# Patient Record
Sex: Male | Born: 1990 | Race: Black or African American | Hispanic: No | Marital: Single | Smoking: Never smoker
Health system: Southern US, Community
[De-identification: ages and names within clinical notes are randomized; demographics above are authoritative.]

---
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
## 2015-04-18 ENCOUNTER — Encounter (HOSPITAL_COMMUNITY): Payer: Self-pay | Admitting: *Deleted

## 2015-04-18 ENCOUNTER — Emergency Department (INDEPENDENT_AMBULATORY_CARE_PROVIDER_SITE_OTHER): Payer: BLUE CROSS/BLUE SHIELD

## 2015-04-18 ENCOUNTER — Emergency Department (INDEPENDENT_AMBULATORY_CARE_PROVIDER_SITE_OTHER)
Admission: EM | Admit: 2015-04-18 | Discharge: 2015-04-18 | Disposition: A | Discharge status: Home / Self Care | Payer: BLUE CROSS/BLUE SHIELD | Source: Home / Self Care | Attending: Family Medicine | Admitting: Family Medicine

## 2015-04-18 DIAGNOSIS — S93401A Sprain of unspecified ligament of right ankle, initial encounter: Secondary | ICD-10-CM | POA: Diagnosis not present

## 2015-04-18 MED ORDER — DICLOFENAC SODIUM 75 MG PO TBEC: 75.0000 mg | DELAYED_RELEASE_TABLET | Freq: Two times a day (BID) | ORAL | Status: DC | PRN

## 2015-04-18 NOTE — ED Notes (Signed)
Assessment per Dr. Denyse Amassorey.  Pt c/o rolling right ankle 2 days ago.

## 2015-04-18 NOTE — ED Provider Notes (Signed)
Joshua Sullivan is a 24 y.o. male who presents to Urgent Care today for ankle injury. Patient suffered an inversion injury to his right ankle 3  days ago while playing basketball. He notes pain and swelling and some limping. Wearing a tight shoe helps a lot. Ibuprofen and Tylenol have not been helpful. No radiating pain weakness numbness fevers or chills.   History reviewed. No pertinent past medical history. History reviewed. No pertinent past surgical history. History  Substance Use Topics  . Smoking status: Never Smoker   . Smokeless tobacco: Not on file  . Alcohol Use: No   ROS as above Medications: No current facility-administered medications for this encounter.   Current Outpatient Prescriptions  Medication Sig Dispense Refill  . diclofenac (VOLTAREN) 75 MG EC tablet Take 1 tablet (75 mg total) by mouth 2 (two) times daily as needed. 30 tablet 0   No Known Allergies   Exam:  BP 123/72 mmHg  Pulse 65  Temp(Src) 97.7 F (36.5 C) (Oral)  Resp 18  SpO2 96% Gen: Well NAD Right leg. Knee normal-appearing nontender normal motion stable ligamentous exam. Ankle swollen medially and laterally with maximal tenderness at the lateral malleolus but mild tenderness at the medial malleolus. Stable ligamentous exam. Foot normal-appearing nontender with normal pulses capillary refill and sensation.  No results found for this or any previous visit (from the past 24 hour(s)). Dg Ankle Complete Right  04/18/2015   CLINICAL DATA:  Lateral ankle pain and swelling. Initial encounter. Ankle injury.  EXAM: RIGHT ANKLE - COMPLETE 3+ VIEW  COMPARISON:  None.  FINDINGS: Anatomic alignment. Negative for fracture. Man ankle mortise congruent. Talar dome intact. 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  IMPRESSION: Negative.   Electronically Signed   By: Andreas NewportGeoffrey  Lamke M.D.   On: 04/18/2015 16:40    Assessment and Plan: 24 y.o. male with  ankle sprain. Treat with ASO brace and NSAIDs. Return as needed.  Discussed warning signs or symptoms. Please see discharge instructions. Patient expresses understanding.     Rodolph BongEvan S Roman Sandall, MD 04/18/15 [phone]

## 2015-04-18 NOTE — Discharge Instructions (Signed)
Thank you for coming in today. ° ° °Acute Ankle Sprain °with Phase I Rehab °An acute ankle sprain is a partial or complete tear in one or more of the ligaments of the ankle due to traumatic injury. The severity of the injury depends on both the number of ligaments sprained and the grade of sprain. There are 3 grades of sprains.  °· A grade 1 sprain is a mild sprain. There is a slight pull without obvious tearing. There is no loss of strength, and the muscle and ligament are the correct length. °· A grade 2 sprain is a moderate sprain. There is tearing of fibers within the substance of the ligament where it connects two bones or two cartilages. The length of the ligament is increased, and there is usually decreased strength. °· A grade 3 sprain is a complete rupture of the ligament and is uncommon. °In addition to the grade of sprain, there are three types of ankle sprains.  °Lateral ankle sprains: This is a sprain of one or more of the three ligaments on the outer side (lateral) of the ankle. These are the most common sprains. °Medial ankle sprains: There is one large triangular ligament of the inner side (medial) of the ankle that is susceptible to injury. Medial ankle sprains are less common. °Syndesmosis, "high ankle," sprains: The syndesmosis is the ligament that connects the two bones of the lower leg. Syndesmosis sprains usually only occur with very severe ankle sprains. °SYMPTOMS °· Pain, tenderness, and swelling in the ankle, starting at the side of injury that may progress to the whole ankle and foot with time. °· "Pop" or tearing sensation at the time of injury. °· Bruising that may spread to the heel. °· Impaired ability to walk soon after injury. °CAUSES  °· Acute ankle sprains are caused by trauma placed on the ankle that temporarily forces or pries the anklebone (talus) out of its normal socket. °· Stretching or tearing of the ligaments that normally hold the joint in place (usually due to a twisting  injury). °RISK INCREASES WITH: °· Previous ankle sprain. °· Sports in which the foot may land awkwardly (i.e., basketball, volleyball, or soccer) or walking or running on uneven or rough surfaces. °· Shoes with inadequate support to prevent sideways motion when stress occurs. °· Poor strength and flexibility. °· Poor balance skills. °· Contact sports. °PREVENTION  °· Warm up and stretch properly before activity. °· Maintain physical fitness: °¨ Ankle and leg flexibility, muscle strength, and endurance. °¨ Cardiovascular fitness. °· Balance training activities. °· Use proper technique and have a coach correct improper technique. °· Taping, protective strapping, bracing, or high-top tennis shoes may help prevent injury. Initially, tape is best; however, it loses most of its support function within 10 to 15 minutes. °· Wear proper-fitted protective shoes (High-top shoes with taping or bracing is more effective than either alone). °· Provide the ankle with support during sports and practice activities for 12 months following injury. °PROGNOSIS  °· If treated properly, ankle sprains can be expected to recover completely; however, the length of recovery depends on the degree of injury. °· A grade 1 sprain usually heals enough in 5 to 7 days to allow modified activity and requires an average of 6 weeks to heal completely. °· A grade 2 sprain requires 6 to 10 weeks to heal completely. °· A grade 3 sprain requires 12 to 16 weeks to heal. °· A syndesmosis sprain often takes more than 3 months to heal. °RELATED   COMPLICATIONS  °· Frequent recurrence of symptoms may result in a chronic problem. Appropriately addressing the problem the first time decreases the frequency of recurrence and optimizes healing time. Severity of the initial sprain does not predict the likelihood of later instability. °· Injury to other structures (bone, cartilage, or tendon). °· A chronically unstable or arthritic ankle joint is a possibility with  repeated sprains. °TREATMENT °Treatment initially involves the use of ice, medication, and compression bandages to help reduce pain and inflammation. Ankle sprains are usually immobilized in a walking cast or boot to allow for healing. Crutches may be recommended to reduce pressure on the injury. After immobilization, strengthening and stretching exercises may be necessary to regain strength and a full range of motion. Surgery is rarely needed to treat ankle sprains. °MEDICATION  °· Nonsteroidal anti-inflammatory medications, such as aspirin and ibuprofen (do not take for the first 3 days after injury or within 7 days before surgery), or other minor pain relievers, such as acetaminophen, are often recommended. Take these as directed by your caregiver. Contact your caregiver immediately if any bleeding, stomach upset, or signs of an allergic reaction occur from these medications. °· Ointments applied to the skin may be helpful. °· Pain relievers may be prescribed as necessary by your caregiver. Do not take prescription pain medication for longer than 4 to 7 days. Use only as directed and only as much as you need. °HEAT AND COLD °· Cold treatment (icing) is used to relieve pain and reduce inflammation for acute and chronic cases. Cold should be applied for 10 to 15 minutes every 2 to 3 hours for inflammation and pain and immediately after any activity that aggravates your symptoms. Use ice packs or an ice massage. °· Heat treatment may be used before performing stretching and strengthening activities prescribed by your caregiver. Use a heat pack or a warm soak. °SEEK IMMEDIATE MEDICAL CARE IF:  °· Pain, swelling, or bruising worsens despite treatment. °· You experience pain, numbness, discoloration, or coldness in the foot or toes. °· New, unexplained symptoms develop (drugs used in treatment may produce side effects.) °EXERCISES  °PHASE I EXERCISES °RANGE OF MOTION (ROM) AND STRETCHING EXERCISES - Ankle Sprain, Acute  Phase I, Weeks 1 to 2 °These exercises may help you when beginning to restore flexibility in your ankle. You will likely work on these exercises for the 1 to 2 weeks after your injury. Once your physician, physical therapist, or athletic trainer sees adequate progress, he or she will advance your exercises. While completing these exercises, remember:  °· Restoring tissue flexibility helps normal motion to return to the joints. This allows healthier, less painful movement and activity. °· An effective stretch should be held for at least 30 seconds. °· A stretch should never be painful. You should only feel a gentle lengthening or release in the stretched tissue. °RANGE OF MOTION - Dorsi/Plantar Flexion °· While sitting with your right / left knee straight, draw the top of your foot upwards by flexing your ankle. Then reverse the motion, pointing your toes downward. °· Hold each position for __________ seconds. °· After completing your first set of exercises, repeat this exercise with your knee bent. °Repeat __________ times. Complete this exercise __________ times per day.  °RANGE OF MOTION - Ankle Alphabet °· Imagine your right / left big toe is a pen. °· Keeping your hip and knee still, write out the entire alphabet with your "pen." Make the letters as large as you can without increasing any   discomfort. °Repeat __________ times. Complete this exercise __________ times per day.  °STRENGTHENING EXERCISES - Ankle Sprain, Acute -Phase I, Weeks 1 to 2 °These exercises may help you when beginning to restore strength in your ankle. You will likely work on these exercises for 1 to 2 weeks after your injury. Once your physician, physical therapist, or athletic trainer sees adequate progress, he or she will advance your exercises. While completing these exercises, remember:  °· Muscles can gain both the endurance and the strength needed for everyday activities through controlled exercises. °· Complete these exercises as  instructed by your physician, physical therapist, or athletic trainer. Progress the resistance and repetitions only as guided. °· You may experience muscle soreness or fatigue, but the pain or discomfort you are trying to eliminate should never worsen during these exercises. If this pain does worsen, stop and make certain you are following the directions exactly. If the pain is still present after adjustments, discontinue the exercise until you can discuss the trouble with your clinician. °STRENGTH - Dorsiflexors °· Secure a rubber exercise band/tubing to a fixed object (i.e., table, pole) and loop the other end around your right / left foot. °· Sit on the floor facing the fixed object. The band/tubing should be slightly tense when your foot is relaxed. °· Slowly draw your foot back toward you using your ankle and toes. °· Hold this position for __________ seconds. Slowly release the tension in the band and return your foot to the starting position. °Repeat __________ times. Complete this exercise __________ times per day.  °STRENGTH - Plantar-flexors  °· Sit with your right / left leg extended. Holding onto both ends of a rubber exercise band/tubing, loop it around the ball of your foot. Keep a slight tension in the band. °· Slowly push your toes away from you, pointing them downward. °· Hold this position for __________ seconds. Return slowly, controlling the tension in the band/tubing. °Repeat __________ times. Complete this exercise __________ times per day.  °STRENGTH - Ankle Eversion °· Secure one end of a rubber exercise band/tubing to a fixed object (table, pole). Loop the other end around your foot just before your toes. °· Place your fists between your knees. This will focus your strengthening at your ankle. °· Drawing the band/tubing across your opposite foot, slowly, pull your little toe out and up. Make sure the band/tubing is positioned to resist the entire motion. °· Hold this position for __________  seconds. °Have your muscles resist the band/tubing as it slowly pulls your foot back to the starting position.  °Repeat __________ times. Complete this exercise __________ times per day.  °STRENGTH - Ankle Inversion °· Secure one end of a rubber exercise band/tubing to a fixed object (table, pole). Loop the other end around your foot just before your toes. °· Place your fists between your knees. This will focus your strengthening at your ankle. °· Slowly, pull your big toe up and in, making sure the band/tubing is positioned to resist the entire motion. °· Hold this position for __________ seconds. °· Have your muscles resist the band/tubing as it slowly pulls your foot back to the starting position. °Repeat __________ times. Complete this exercises __________ times per day.  °STRENGTH - Towel Curls °· Sit in a chair positioned on a non-carpeted surface. °· Place your right / left foot on a towel, keeping your heel on the floor. °· Pull the towel toward your heel by only curling your toes. Keep your heel on the floor. °·   If instructed by your physician, physical therapist, or athletic trainer, add weight to the end of the towel. °Repeat __________ times. Complete this exercise __________ times per day. °Document Released: 06/29/2005 Document Revised: 04/14/2014 Document Reviewed: 03/12/2009 °[employer]® Patient Information ©2015 [employer]. This information is not intended to replace advice given to you by your health care provider. Make sure you discuss any questions you have with your health care provider. ° ° °Acute Ankle Sprain °with Phase II Rehab °An acute ankle sprain is a partial or complete tear in one or more of the ligaments of the ankle due to traumatic injury. The severity of the injury depends on both the number of ligaments sprained and the grade of sprain. There are 3 grades of sprains. °· A grade 1 sprain is a mild sprain. There is a slight pull without obvious tearing. There is no loss of  strength, and the muscle and ligament are the correct length. °· A grade 2 sprain is a moderate sprain. There is tearing of fibers within the substance of the ligament where it connects two bones or two cartilages. The length of the ligament is increased, and there is usually decreased strength. °· A grade 3 sprain is a complete rupture of the ligament and is uncommon. °In addition to the grade of sprain, there are 3 types of ankle sprains.  °Lateral ankle sprains. This is a sprain of one or more of the 3 ligaments on the outer side (lateral) of the ankle. These are the most common sprains. °Medial ankle sprains. There is one large triangular ligament on the inner side (medial) of the ankle that is susceptible to injury. Medial ankle sprains are less common. °Syndesmosis, "high ankle," sprains. The syndesmosis is the ligament that connects the two bones of the lower leg. Syndesmosis sprains usually only occur with very severe ankle sprains. °SYMPTOMS °· Pain, tenderness, and swelling in the ankle, starting at the side of injury that may progress to the whole ankle and foot with time. °· "Pop" or tearing sensation at the time of injury. °· Bruising that may spread to the heel. °· Impaired ability to walk soon after injury. °CAUSES  °· Acute ankle sprains are caused by trauma placed on the ankle that temporarily forces or pries the anklebone (talus) out of its normal socket. °· Stretching or tearing of the ligaments that normally hold the joint in place (usually due to a twisting injury). °RISK INCREASES WITH: °· Previous ankle sprain. °· Sports in which the foot may land awkwardly (basketball, volleyball, soccer) or walking or running on uneven or rough surfaces. °· Shoes with inadequate support to prevent sideways motion when stress occurs. °· Poor strength and flexibility. °· Poor balance skills. °· Contact sports. °PREVENTION °· Warm up and stretch properly before activity. °· Maintain physical fitness: °¨ Ankle  and leg flexibility, muscle strength, and endurance. °¨ Cardiovascular fitness. °· Balance training activities. °· Use proper technique and have a coach correct improper technique. °· Taping, protective strapping, bracing, or high-top tennis shoes may help prevent injury. Initially, tape is best. However, it loses most of its support function within 10 to 15 minutes. °· Wear proper fitted protective shoes. Combining high-top shoes with taping or bracing is more effective than using either alone. °· Provide the ankle with support during sports and practice activities for 12 months following injury. °PROGNOSIS  °· If treated properly, ankle sprains can be expected to recover completely. However, the length of recovery depends on the degree of   injury. °· A grade 1 sprain usually heals enough in 5 to 7 days to allow modified activity and requires an average of 6 weeks to heal completely. °· A grade 2 sprain requires 6 to 10 weeks to heal completely. °· A grade 3 sprain requires 12 to 16 weeks to heal. °· A syndesmosis sprain often takes more than 3 months to heal. °RELATED COMPLICATIONS  °· Frequent recurrence of symptoms may result in a chronic problem. Appropriately addressing the problem the first time decreases the frequency of recurrence and optimizes healing time. Severity of initial sprain does not predict the likelihood of later instability. °· Injury to other structures (bone, cartilage, or tendon). °· Chronically unstable or arthritic ankle joint are possible with repeated sprains. °TREATMENT °Treatment initially involves the use of ice, medicine, and compression bandages to help reduce pain and inflammation. Ankle sprains are usually immobilized in a walking cast or boot to allow for healing. Crutches may be recommended to reduce pressure on the injury. After immobilization, strengthening and stretching exercises may be necessary to regain strength and a full range of motion. Surgery is rarely needed to treat  ankle sprains. °MEDICATION  °· Nonsteroidal anti-inflammatory medicines, such as aspirin and ibuprofen (do not take for the first 3 days after injury or within 7 days before surgery), or other minor pain relievers, such as acetaminophen, are often recommended. Take these as directed by your caregiver. Contact your caregiver immediately if any bleeding, stomach upset, or signs of an allergic reaction occur from these medicines. °· Ointments applied to the skin may be helpful. °· Pain relievers may be prescribed as necessary by your caregiver. Do not take prescription pain medicine for longer than 4 to 7 days. Use only as directed and only as much as you need. °HEAT AND COLD °· Cold treatment (icing) is used to relieve pain and reduce inflammation for acute and chronic cases. Cold should be applied for 10 to 15 minutes every 2 to 3 hours for inflammation and pain and immediately after any activity that aggravates your symptoms. Use ice packs or an ice massage. °· Heat treatment may be used before performing stretching and strengthening activities prescribed by your caregiver. Use a heat pack or a warm soak. °SEEK IMMEDIATE MEDICAL CARE IF:  °· Pain, swelling, or bruising worsens despite treatment. °· You experience pain, numbness, discoloration, or coldness in the foot or toes. °· New, unexplained symptoms develop. (Drugs used in treatment may produce side effects.) °EXERCISES  °PHASE II EXERCISES °RANGE OF MOTION (ROM) AND STRETCHING EXERCISES - Ankle Sprain, Acute-Phase II, Weeks 3 to 4 °After your physician, physical therapist, or athletic trainer feels your knee has made progress significant enough to begin more advanced exercises, he or she may recommend completing some of the following exercises. Although each person heals at different rates, most people will be ready for these exercises between 3 and 4 weeks after their injury. Do not begin these exercises until you have your caregiver's permission. He or she  may also advise you to continue with the exercises which you completed in Phase I of your rehabilitation. While completing these exercises, remember:  °· Restoring tissue flexibility helps normal motion to return to the joints. This allows healthier, less painful movement and activity. °· An effective stretch should be held for at least 30 seconds. °· A stretch should never be painful. You should only feel a gentle lengthening or release in the stretched tissue. °RANGE OF MOTION - Ankle Plantar Flexion  °·   Sit with your right / left leg crossed over your opposite knee. °· Use your opposite hand to pull the top of your foot and toes toward you. °· You should feel a gentle stretch on the top of your foot/ankle. Hold this position for __________. °Repeat __________ times. Complete __________ times per day.  °RANGE OF MOTION - Ankle Eversion °· Sit with your right / left ankle crossed over your opposite knee. °· Grip your foot with your opposite hand, placing your thumb on the top of your foot and your fingers across the bottom of your foot. °· Gently push your foot downward with a slight rotation so your littlest toes rise slightly °· You should feel a gentle stretch on the inside of your ankle. Hold the stretch for __________ seconds. °Repeat __________ times. Complete this exercise __________ times per day.  °RANGE OF MOTION - Ankle Inversion °· Sit with your right / left ankle crossed over your opposite knee. °· Grip your foot with your opposite hand, placing your thumb on the bottom of your foot and your fingers across the top of your foot. °· Gently pull your foot so the smallest toe comes toward you and your thumb pushes the inside of the ball of your foot away from you. °· You should feel a gentle stretch on the outside of your ankle. Hold the stretch for __________ seconds. °Repeat __________ times. Complete this exercise __________ times per day.  °STRETCH - Gastrocsoleus °· Sit with your right / left leg  extended. Holding onto both ends of a belt or towel, loop it around the ball of your foot. °· Keeping your right / left ankle and foot relaxed and your knee straight, pull your foot and ankle toward you using the belt/towel. °· You should feel a gentle stretch behind your calf or knee. Hold this position for __________ seconds. °Repeat __________ times. Complete this stretch __________ times per day.  °RANGE OF MOTION - Ankle Dorsiflexion, Active Assisted °· Remove shoes and sit on a chair that is preferably not on a carpeted surface. °· Place right / left foot under knee. Extend your opposite leg for support. °· Keeping your heel down, slide your right / left foot back toward the chair until you feel a stretch at your ankle or calf. If you do not feel a stretch, slide your bottom forward to the edge of the chair while still keeping your heel down. °· Hold this stretch for __________ seconds. °Repeat __________ times. Complete this stretch __________ times per day.  °STRETCH - Gastroc, Standing  °· Place hands on wall. °· Extend right / left leg and place a folded washcloth under the arch of your foot for support. Keep the front knee somewhat bent. °· Slightly point your toes inward on your back foot. °· Keeping your right / left heel on the floor and your knee straight, shift your weight toward the wall, not allowing your back to arch. °· You should feel a gentle stretch in the calf. Hold this position for __________ seconds. °Repeat __________ times. Complete this stretch __________ times per day. °STRETCH - Soleus, Standing °· Place hands on wall. °· Extend right / left leg and place a folded washcloth under the arch of your foot for support. Keep the front knee somewhat bent. °· Slightly point your toes inward on your back foot. °· Keep your right / left heel on the floor, bend your back knee, and slightly shift your weight over the back leg so   that you feel a gentle stretch deep in your back calf. °· Hold this  position for __________ seconds. °Repeat __________ times. Complete this stretch __________ times per day. °STRETCH - Gastrocsoleus, Standing °Note: This exercise can place a lot of stress on your foot and ankle. Please complete this exercise only if specifically instructed by your caregiver.  °· Place the ball of your right / left foot on a step, keeping your other foot firmly on the same step. °· Hold on to the wall or a rail for balance. °· Slowly lift your other foot, allowing your body weight to press your heel down over the edge of the step. °· You should feel a stretch in your right / left calf. °· Hold this position for __________ seconds. °· Repeat this exercise with a slight bend in your knee. °Repeat __________ times. Complete this stretch __________ times per day.  °STRENGTHENING EXERCISES - Ankle Sprain, Acute-Phase II °Around 3 to 4 weeks after your injury, you may progress to some of these exercises in your rehabilitation program. Do not begin these until you have your caregiver's permission. Although your condition has improved, the Phase I exercises will continue to be helpful and you may continue to complete them. As you complete strengthening exercises, remember:  °· Strong muscles with good endurance tolerate stress better. °· Do the exercises as initially prescribed by your caregiver. Progress slowly with each exercise, gradually increasing the number of repetitions and weight used under his or her guidance. °· You may experience muscle soreness or fatigue, but the pain or discomfort you are trying to eliminate should never worsen during these exercises. If this pain does worsen, stop and make certain you are following the directions exactly. If the pain is still present after adjustments, discontinue the exercise until you can discuss the trouble with your caregiver. °STRENGTH - Plantar-flexors, Standing °· Stand with your feet shoulder width apart. Steady yourself with a wall or table using as  little support as needed. °· Keeping your weight evenly spread over the width of your feet, rise up on your toes.* °· Hold this position for __________ seconds. °Repeat __________ times. Complete this exercise __________ times per day.  °*If this is too easy, shift your weight toward your right / left leg until you feel challenged. Ultimately, you may be asked to do this exercise with your right / left foot only. °STRENGTH - Dorsiflexors and Plantar-flexors, Heel/toe Walking °· Dorsiflexion: Walk on your heels only. Keep your toes as high as possible. °· Walk for ____________________ seconds/feet. °· Repeat __________ times. Complete __________ times per day. °· Plantar flexion: Walk on your toes only. Keep your heels as high as possible. °· Walk for ____________________ seconds/feet. °Repeat __________ times. Complete __________ times per day.  °BALANCE - Tandem Walking °· Place your uninjured foot on a line 2 to 4 inches wide and at least 10 feet long. °· Keeping your balance without using anything for extra support, place your right / left heel directly in front of your other foot. °· Slowly raise your back foot up, lifting from the heel to the toes, and place it directly in front of the right / left foot. °· Continue to walk along the line slowly. Walk for ____________________ feet. °Repeat ____________________ times. Complete ____________________ times per day. °BALANCE - Inversion/Eversion °Use caution, these are advanced level exercises. Do not begin them until you are advised to do so.  °· Create a balance board using a sturdy board about 1 ½   feet long and at 1 to 1 ½ feet wide and a 1 ½ inch diameter rod or pipe that is as long as the board's width. A copper pipe or a solid broomstick work well. °· Stand on a non-carpeted surface near a countertop or wall. Step onto the board so that your feet are hip-width apart and equally straddle the rod/pipe. °· Keeping your feet in place, complete these two exercises  without shifting your upper body or hips: °¨ Tip the board from side-to-side. Control the movement so the board does not forcefully strike the ground. The board should silently tap the ground. °¨ Tip the board side-to-side without striking the ground. Occasionally pause and maintain a steady position at various points. °¨ Repeat the first two exercises, but use only your right / left foot. Place your right / left foot directly over the rod/pipe. °Repeat __________ times. Complete this exercise __________ times a day. °BALANCE - Plantar/Dorsi Flexion °Use caution, these are advanced level exercises. Do not begin them until you are advised to do so.  °· Create a balance board using a sturdy board about 1 ½ feet long and at 1 to 1 ½ feet wide and a 1 ½ inch diameter rod or pipe that is as long as the board's width. A copper pipe or a solid broomstick work well. °· Stand on a non-carpeted surface near a countertop or wall. Stand on the board so that the rod/pipe runs under the arches in your feet. °· Keeping your feet in place, complete these two exercises without shifting your upper body or hips: °¨ Tip the board from side-to-side. Control the movement so the board does not forcefully strike the ground. The board should silently tap the ground. °¨ Tip the board side-to-side without striking the ground. Occasionally pause and maintain a steady position at various points. °¨ Repeat the first two exercises, but use only your right / left foot. Stand in the center of the board. °Repeat __________ times. Complete this exercise __________ times a day. °STRENGTH - Plantar-flexors, Eccentric °Note: This exercise can place a lot of stress on your foot and ankle. Please complete this exercise only if specifically instructed by your caregiver.  °· Place the balls of your feet on a step. With your hands, use only enough support from a wall or rail to keep your balance. °· Keep your knees straight and rise up on your  toes. °· Slowly shift your weight entirely to your toes and pick up your opposite foot. Gently and with controlled movement, lower your weight through your right / left foot so that your heel drops below the level of the step. You will feel a slight stretch in the back of your calf at the ending position. °· Use the healthy leg to help rise up onto the balls of both feet, then lower weight only on the right / left leg again. Build up to 15 repetitions. Then progress to 3 consecutive sets of 15 repetitions.* °· After completing the above exercise, complete the same exercise with a slight knee bend (about 30 degrees). Again, build up to 15 repetitions. Then progress to 3 consecutive sets of 15 repetitions.* °Perform this exercise __________ times per day.  °*When you easily complete 3 sets of 15, your physician, physical therapist, or athletic trainer may advise you to add resistance by wearing a backpack filled with additional weight. °Document Released: 03/20/2006 Document Revised: 02/20/2012 Document Reviewed: 03/12/2009 °[employer]® Patient Information ©2015 [employer]. This information is   not intended to replace advice given to you by your health care provider. Make sure you discuss any questions you have with your health care provider. ° °

## 2015-07-13 ENCOUNTER — Emergency Department (INDEPENDENT_AMBULATORY_CARE_PROVIDER_SITE_OTHER)
Admission: EM | Admit: 2015-07-13 | Discharge: 2015-07-13 | Disposition: A | Discharge status: Home / Self Care | Payer: BLUE CROSS/BLUE SHIELD | Source: Home / Self Care | Attending: Family Medicine | Admitting: Family Medicine

## 2015-07-13 ENCOUNTER — Emergency Department (INDEPENDENT_AMBULATORY_CARE_PROVIDER_SITE_OTHER): Payer: BLUE CROSS/BLUE SHIELD

## 2015-07-13 ENCOUNTER — Encounter (HOSPITAL_COMMUNITY): Payer: Self-pay | Admitting: *Deleted

## 2015-07-13 DIAGNOSIS — S93402A Sprain of unspecified ligament of left ankle, initial encounter: Secondary | ICD-10-CM | POA: Diagnosis not present

## 2015-07-13 MED ORDER — DICLOFENAC POTASSIUM 50 MG PO TABS: 50.0000 mg | ORAL_TABLET | Freq: Three times a day (TID) | ORAL | Status: DC

## 2015-07-13 NOTE — ED Notes (Signed)
Pt  Reports  He  Twisted  His   l  Ankle      Yesterday  While  Playing  Basketball  -  He  Has  Pain  And  Swelling  To  The l  Ankle         He  denys  Any    Other injurys     He  Is sitting  In  Wheelchair

## 2015-07-13 NOTE — ED Provider Notes (Signed)
CSN: [id number]     Arrival date & time 07/13/15  1624 History   First MD Initiated Contact with Patient 07/13/15 1820     Chief Complaint  Patient presents with  . Ankle Pain   (Consider location/radiation/quality/duration/timing/severity/associated sxs/prior Treatment) Patient is a 24 y.o. male presenting with ankle pain. The history is provided by the patient.  Ankle Pain Location:  Ankle Time since incident:  1 day Injury: yes   Mechanism of injury comment:  Twisted playing bball. Ankle location:  L ankle Pain details:    Radiates to:  Does not radiate   Progression:  Unchanged Chronicity:  New Dislocation: no   Relieved by:  None tried Worsened by:  Nothing tried Ineffective treatments:  None tried Associated symptoms: decreased ROM, stiffness and swelling     History reviewed. No pertinent past medical history. History reviewed. No pertinent past surgical history. History reviewed. No pertinent family history. History  Substance Use Topics  . Smoking status: Never Smoker   . Smokeless tobacco: Not on file  . Alcohol Use: No    Review of Systems  Constitutional: Negative.   Musculoskeletal: Positive for joint swelling and stiffness.  Skin: Negative.     Allergies  Review of patient's allergies indicates no known allergies.  Home Medications   Prior to Admission medications   Medication Sig Start Date End Date Taking? Authorizing Provider  diclofenac (CATAFLAM) 50 MG tablet Take 1 tablet (50 mg total) by mouth 3 (three) times daily. 07/13/15   Linna Hoff, MD  diclofenac (VOLTAREN) 75 MG EC tablet Take 1 tablet (75 mg total) by mouth 2 (two) times daily as needed. 04/18/15   Rodolph Bong, MD   BP 132/86 mmHg  Pulse 78  Temp(Src) 98.6 F (37 C) (Oral)  Resp 20  SpO2 100% Physical Exam  Constitutional: He is oriented to person, place, and time. He appears well-developed and well-nourished. No distress.  Musculoskeletal: He exhibits tenderness.       Left  ankle: He exhibits decreased range of motion and swelling. He exhibits no ecchymosis and normal pulse. Tenderness. Lateral malleolus tenderness found. No head of 5th metatarsal and no proximal fibula tenderness found. Achilles tendon normal.  Neurological: He is alert and oriented to person, place, and time.  Skin: Skin is warm and dry.  Nursing note and vitals reviewed.   ED Course  Procedures (including critical care time) Labs Review Labs Reviewed - No data to display  Imaging Review Dg Ankle Complete Left  07/13/2015   CLINICAL DATA:  Initial encounter for ankle injury playing basketball earlier today with lateral pain and swelling.  EXAM: LEFT ANKLE COMPLETE - 3+ VIEW  COMPARISON:  None.  FINDINGS: No evidence for fracture. No subluxation or dislocation. Ankle mortise is preserved. Sclerotic, slightly expansile lesion in the lateral cortex of the distal tibial metaphysis is compatible with a healed nonossifying fibroma. Soft tissue swelling/ edema overlies the lateral malleolus.  IMPRESSION: Lateral soft tissue swelling without acute fracture.   Electronically Signed   By: Kennith Center M.D.   On: 07/13/2015 19:06    X-rays reviewed and report per radiologist.  MDM   1. Ankle sprain, left, initial encounter        Linna Hoff, MD 07/13/15 Barry Brunner

## 2015-07-13 NOTE — Discharge Instructions (Signed)
Wear ankle support as needed for comfort,crutches as needed, activity as tolerated. advil or pain medicine as needed, see orthopedist if further problems.

## 2017-03-13 ENCOUNTER — Ambulatory Visit (INDEPENDENT_AMBULATORY_CARE_PROVIDER_SITE_OTHER): Payer: Worker's Compensation

## 2017-03-13 ENCOUNTER — Encounter (HOSPITAL_COMMUNITY): Payer: Self-pay | Admitting: Emergency Medicine

## 2017-03-13 ENCOUNTER — Ambulatory Visit (HOSPITAL_COMMUNITY)
Admission: EM | Admit: 2017-03-13 | Discharge: 2017-03-13 | Disposition: A | Discharge status: Home / Self Care | Payer: Worker's Compensation | Attending: Family Medicine | Admitting: Family Medicine

## 2017-03-13 DIAGNOSIS — S90111A Contusion of right great toe without damage to nail, initial encounter: Secondary | ICD-10-CM | POA: Diagnosis not present

## 2017-03-13 DIAGNOSIS — W208XXA Other cause of strike by thrown, projected or falling object, initial encounter: Secondary | ICD-10-CM

## 2017-03-13 DIAGNOSIS — S90112A Contusion of left great toe without damage to nail, initial encounter: Secondary | ICD-10-CM

## 2017-03-13 MED ORDER — BACITRACIN ZINC 500 UNIT/GM EX OINT
TOPICAL_OINTMENT | CUTANEOUS | Status: AC
  Filled 2017-03-13: qty 0.9

## 2017-03-13 MED ORDER — MUPIROCIN 2 % EX OINT: 1.0000 "application " | TOPICAL_OINTMENT | Freq: Three times a day (TID) | CUTANEOUS | 1 refills | Status: AC

## 2017-03-13 MED ORDER — HYDROCODONE-ACETAMINOPHEN 5-325 MG PO TABS: 1.0000 | ORAL_TABLET | Freq: Four times a day (QID) | ORAL | 0 refills | Status: AC | PRN

## 2017-03-13 NOTE — ED Triage Notes (Signed)
The patient presented to the [facility] with a complaint of great toe pain on his right foot that occurred today when he dropped a table on it at work.

## 2017-03-13 NOTE — ED Provider Notes (Signed)
[facility]    CSN: [id number] Arrival date & time: 03/13/17  1659     History   Chief Complaint Chief Complaint  Patient presents with  . Foot Pain    HPI Joshua Sullivan is a 26 y.o. male.   The patient presented to the [facility] with a complaint of great toe pain on his right foot that occurred today when he dropped a table on it at work.  He works for [employer] and a box broke dropping all the contents to include his right great toe      History reviewed. No pertinent past medical history.  There are no active problems to display for this patient.   History reviewed. No pertinent surgical history.     Home Medications    Prior to Admission medications   Medication Sig Start Date End Date Taking? Authorizing Provider  HYDROcodone-acetaminophen (NORCO) 5-325 MG tablet Take 1 tablet by mouth every 6 (six) hours as needed for moderate pain. 03/13/17   Elvina Sidle, MD    Family History History reviewed. No pertinent family history.  Social History Social History  Substance Use Topics  . Smoking status: Never Smoker  . Smokeless tobacco: Not on file  . Alcohol use No     Allergies   Patient has no known allergies.   Review of Systems Review of Systems  Musculoskeletal: Positive for gait problem.  All other systems reviewed and are negative.    Physical Exam Triage Vital Signs ED Triage Vitals  Enc Vitals Group     BP 03/13/17 1716 139/72     Pulse Rate 03/13/17 1716 85     Resp 03/13/17 1716 18     Temp 03/13/17 1716 98.7 F (37.1 C)     Temp Source 03/13/17 1716 Oral     SpO2 03/13/17 1716 97 %     Weight --      Height --      Head Circumference --      Peak Flow --      Pain Score 03/13/17 1715 5     Pain Loc --      Pain Edu? --      Excl. in GC? --    No data found.   Updated Vital Signs BP 139/72 (BP Location: Right Arm)   Pulse 85   Temp 98.7 F (37.1 C) (Oral)   Resp 18   SpO2 97%    Physical Exam  Constitutional:  He is oriented to person, place, and time. He appears well-developed and well-nourished.  HENT:  Right Ear: External ear normal.  Left Ear: External ear normal.  Mouth/Throat: Oropharynx is clear and moist.  Eyes: Conjunctivae and EOM are normal. Pupils are equal, round, and reactive to light.  Neck: Normal range of motion. Neck supple.  Pulmonary/Chest: Effort normal.  Musculoskeletal: He exhibits tenderness. He exhibits no deformity.  Right great toe is mildly swollen with an abrasion at the cuticle and a very tiny subungual hematoma.  Neurological: He is alert and oriented to person, place, and time.  Skin: Skin is warm.  Nursing note and vitals reviewed.    UC Treatments / Results  Labs (all labs ordered are listed, but only abnormal results are displayed) Labs Reviewed - No data to display  EKG  EKG Interpretation None       Radiology No fracture seen  Procedures Procedures (including critical care time)  Medications Ordered in UC Medications - No data to display  Initial Impression / Assessment and Plan / UC Course  I have reviewed the triage vital signs and the nursing notes.  Pertinent labs & imaging results that were available during my care of the patient were reviewed by me and considered in my medical decision making (see chart for details).     Final Clinical Impressions(s) / UC Diagnoses   Final diagnoses:  Contusion of right great toe without damage to nail, initial encounter    New Prescriptions New Prescriptions   HYDROCODONE-ACETAMINOPHEN (NORCO) 5-325 MG TABLET    Take 1 tablet by mouth every 6 (six) hours as needed for moderate pain.     Elvina Sidle, MD 03/13/17 1755

## 2017-03-13 NOTE — Discharge Instructions (Signed)
Expect pain in the toes persist for about 3 days and then it should be comfortable enough to wear regular shoes.

## 2020-07-18 ENCOUNTER — Emergency Department (HOSPITAL_COMMUNITY)
Admission: EM | Admit: 2020-07-18 | Discharge: 2020-07-19 | Disposition: A | Discharge status: Home / Self Care | Payer: Self-pay | Attending: Emergency Medicine | Admitting: Emergency Medicine

## 2020-07-18 ENCOUNTER — Encounter (HOSPITAL_COMMUNITY): Payer: Self-pay

## 2020-07-18 ENCOUNTER — Emergency Department (HOSPITAL_COMMUNITY): Payer: Self-pay

## 2020-07-18 DIAGNOSIS — W2210XA Striking against or struck by unspecified automobile airbag, initial encounter: Secondary | ICD-10-CM | POA: Insufficient documentation

## 2020-07-18 DIAGNOSIS — M25511 Pain in right shoulder: Secondary | ICD-10-CM | POA: Insufficient documentation

## 2020-07-18 DIAGNOSIS — Z5321 Procedure and treatment not carried out due to patient leaving prior to being seen by health care provider: Secondary | ICD-10-CM | POA: Insufficient documentation

## 2020-07-18 DIAGNOSIS — M25512 Pain in left shoulder: Secondary | ICD-10-CM | POA: Insufficient documentation

## 2020-07-18 DIAGNOSIS — M542 Cervicalgia: Secondary | ICD-10-CM | POA: Insufficient documentation

## 2020-07-18 DIAGNOSIS — M25529 Pain in unspecified elbow: Secondary | ICD-10-CM | POA: Insufficient documentation
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
## 2020-07-19 NOTE — ED Notes (Addendum)
Pt called multiple times no answer
